# Patient Record
Sex: Female | Born: 1945 | Hispanic: Yes | Smoking: Never smoker
Health system: Southern US, Community
[De-identification: ages and names within clinical notes are randomized; demographics above are authoritative.]

## PROBLEM LIST (undated history)

## (undated) DIAGNOSIS — R112 Nausea with vomiting, unspecified: Secondary | ICD-10-CM

## (undated) DIAGNOSIS — K5792 Diverticulitis of intestine, part unspecified, without perforation or abscess without bleeding: Secondary | ICD-10-CM

## (undated) DIAGNOSIS — T8859XA Other complications of anesthesia, initial encounter: Secondary | ICD-10-CM

## (undated) DIAGNOSIS — E119 Type 2 diabetes mellitus without complications: Secondary | ICD-10-CM

## (undated) DIAGNOSIS — I10 Essential (primary) hypertension: Secondary | ICD-10-CM

## (undated) DIAGNOSIS — E7800 Pure hypercholesterolemia, unspecified: Secondary | ICD-10-CM

## (undated) DIAGNOSIS — K219 Gastro-esophageal reflux disease without esophagitis: Secondary | ICD-10-CM

## (undated) DIAGNOSIS — Z9889 Other specified postprocedural states: Secondary | ICD-10-CM

## (undated) DIAGNOSIS — K529 Noninfective gastroenteritis and colitis, unspecified: Secondary | ICD-10-CM

## (undated) DIAGNOSIS — Z8489 Family history of other specified conditions: Secondary | ICD-10-CM

---

## 1972-05-07 HISTORY — PX: ABDOMINAL HYSTERECTOMY: SHX81

## 2004-05-07 HISTORY — PX: TOTAL KNEE ARTHROPLASTY: SHX125

## 2016-05-07 HISTORY — PX: CHOLECYSTECTOMY: SHX55

## 2017-05-07 HISTORY — PX: SHOULDER ARTHROSCOPY: SHX128

## 2020-08-31 ENCOUNTER — Emergency Department (HOSPITAL_COMMUNITY): Payer: Medicare PPO

## 2020-08-31 ENCOUNTER — Emergency Department (HOSPITAL_COMMUNITY)
Admission: EM | Admit: 2020-08-31 | Discharge: 2020-08-31 | Discharge status: Against Medical Advice | Payer: Medicare PPO | Attending: Emergency Medicine | Admitting: Emergency Medicine

## 2020-08-31 ENCOUNTER — Emergency Department (HOSPITAL_BASED_OUTPATIENT_CLINIC_OR_DEPARTMENT_OTHER)
Admission: EM | Admit: 2020-08-31 | Discharge: 2020-09-01 | Payer: Medicare PPO | Source: Home / Self Care | Attending: Emergency Medicine | Admitting: Emergency Medicine

## 2020-08-31 ENCOUNTER — Other Ambulatory Visit: Payer: Self-pay

## 2020-08-31 ENCOUNTER — Encounter (HOSPITAL_BASED_OUTPATIENT_CLINIC_OR_DEPARTMENT_OTHER): Payer: Self-pay | Admitting: *Deleted

## 2020-08-31 DIAGNOSIS — R110 Nausea: Secondary | ICD-10-CM | POA: Insufficient documentation

## 2020-08-31 DIAGNOSIS — N3090 Cystitis, unspecified without hematuria: Secondary | ICD-10-CM

## 2020-08-31 DIAGNOSIS — E119 Type 2 diabetes mellitus without complications: Secondary | ICD-10-CM | POA: Insufficient documentation

## 2020-08-31 DIAGNOSIS — Z7984 Long term (current) use of oral hypoglycemic drugs: Secondary | ICD-10-CM | POA: Insufficient documentation

## 2020-08-31 DIAGNOSIS — R519 Headache, unspecified: Secondary | ICD-10-CM | POA: Insufficient documentation

## 2020-08-31 DIAGNOSIS — Z7982 Long term (current) use of aspirin: Secondary | ICD-10-CM | POA: Insufficient documentation

## 2020-08-31 DIAGNOSIS — Z79899 Other long term (current) drug therapy: Secondary | ICD-10-CM | POA: Insufficient documentation

## 2020-08-31 DIAGNOSIS — R2689 Other abnormalities of gait and mobility: Secondary | ICD-10-CM | POA: Insufficient documentation

## 2020-08-31 DIAGNOSIS — I10 Essential (primary) hypertension: Secondary | ICD-10-CM | POA: Insufficient documentation

## 2020-08-31 DIAGNOSIS — Z5321 Procedure and treatment not carried out due to patient leaving prior to being seen by health care provider: Secondary | ICD-10-CM | POA: Insufficient documentation

## 2020-08-31 DIAGNOSIS — Z96651 Presence of right artificial knee joint: Secondary | ICD-10-CM | POA: Insufficient documentation

## 2020-08-31 DIAGNOSIS — Z91040 Latex allergy status: Secondary | ICD-10-CM | POA: Insufficient documentation

## 2020-08-31 DIAGNOSIS — R42 Dizziness and giddiness: Secondary | ICD-10-CM | POA: Insufficient documentation

## 2020-08-31 HISTORY — DX: Diverticulitis of intestine, part unspecified, without perforation or abscess without bleeding: K57.92

## 2020-08-31 HISTORY — DX: Essential (primary) hypertension: I10

## 2020-08-31 HISTORY — DX: Noninfective gastroenteritis and colitis, unspecified: K52.9

## 2020-08-31 HISTORY — DX: Type 2 diabetes mellitus without complications: E11.9

## 2020-08-31 HISTORY — DX: Pure hypercholesterolemia, unspecified: E78.00

## 2020-08-31 LAB — URINALYSIS, ROUTINE W REFLEX MICROSCOPIC
Bilirubin Urine: NEGATIVE
Glucose, UA: NEGATIVE mg/dL
Ketones, ur: 5 mg/dL — AB
Nitrite: NEGATIVE
Protein, ur: 30 mg/dL — AB
Specific Gravity, Urine: 1.02 (ref 1.005–1.030)
WBC, UA: >50 WBC/hpf — ABNORMAL HIGH (ref 0–5)
pH: 6 (ref 5.0–8.0)

## 2020-08-31 LAB — CBC WITH DIFFERENTIAL/PLATELET
Abs Immature Granulocytes: 0.04 10*3/uL (ref 0.00–0.07)
Basophils Absolute: 0.1 10*3/uL (ref 0.0–0.1)
Basophils Relative: 1 %
Eosinophils Absolute: 0 10*3/uL (ref 0.0–0.5)
Eosinophils Relative: 0 %
HCT: 41.4 % (ref 36.0–46.0)
Hemoglobin: 12.9 g/dL (ref 12.0–15.0)
Immature Granulocytes: 0 %
Lymphocytes Relative: 17 %
Lymphs Abs: 1.7 10*3/uL (ref 0.7–4.0)
MCH: 28.5 pg (ref 26.0–34.0)
MCHC: 31.2 g/dL (ref 30.0–36.0)
MCV: 91.6 fL (ref 80.0–100.0)
Monocytes Absolute: 0.6 10*3/uL (ref 0.1–1.0)
Monocytes Relative: 6 %
Neutro Abs: 7.9 10*3/uL — ABNORMAL HIGH (ref 1.7–7.7)
Neutrophils Relative %: 76 %
Platelets: 340 10*3/uL (ref 150–400)
RBC: 4.52 MIL/uL (ref 3.87–5.11)
RDW: 15.3 % (ref 11.5–15.5)
WBC: 10.3 10*3/uL (ref 4.0–10.5)
nRBC: 0 % (ref 0.0–0.2)

## 2020-08-31 LAB — COMPREHENSIVE METABOLIC PANEL
ALT: 13 U/L (ref 0–44)
AST: 26 U/L (ref 15–41)
Albumin: 4 g/dL (ref 3.5–5.0)
Alkaline Phosphatase: 81 U/L (ref 38–126)
Anion gap: 14 (ref 5–15)
BUN: 13 mg/dL (ref 8–23)
CO2: 25 mmol/L (ref 22–32)
Calcium: 9.7 mg/dL (ref 8.9–10.3)
Chloride: 99 mmol/L (ref 98–111)
Creatinine, Ser: 0.68 mg/dL (ref 0.44–1.00)
GFR, Estimated: >60 mL/min (ref >60)
Glucose, Bld: 144 mg/dL — ABNORMAL HIGH (ref 70–99)
Potassium: 3.9 mmol/L (ref 3.5–5.1)
Sodium: 138 mmol/L (ref 135–145)
Total Bilirubin: 0.7 mg/dL (ref 0.3–1.2)
Total Protein: 7.8 g/dL (ref 6.5–8.1)

## 2020-08-31 LAB — CBG MONITORING, ED: Glucose-Capillary: 135 mg/dL — ABNORMAL HIGH (ref 70–99)

## 2020-08-31 MED ORDER — MAGNESIUM SULFATE IN D5W 1-5 GM/100ML-% IV SOLN
1.0000 g | Freq: Once | INTRAVENOUS | Status: AC
  Administered 2020-08-31: 1 g via INTRAVENOUS
  Filled 2020-08-31: qty 100

## 2020-08-31 MED ORDER — SULFAMETHOXAZOLE-TRIMETHOPRIM 800-160 MG PO TABS
1.0000 | ORAL_TABLET | Freq: Once | ORAL | Status: AC
  Administered 2020-08-31: 1 via ORAL
  Filled 2020-08-31: qty 1

## 2020-08-31 MED ORDER — SULFAMETHOXAZOLE-TRIMETHOPRIM 800-160 MG PO TABS: 1.0000 | ORAL_TABLET | Freq: Two times a day (BID) | ORAL | 0 refills | Status: AC

## 2020-08-31 MED ORDER — METOCLOPRAMIDE HCL 5 MG/ML IJ SOLN
10.0000 mg | Freq: Once | INTRAMUSCULAR | Status: AC
  Administered 2020-08-31: 10 mg via INTRAVENOUS
  Filled 2020-08-31: qty 2

## 2020-08-31 MED ORDER — KETOROLAC TROMETHAMINE 15 MG/ML IJ SOLN
15.0000 mg | Freq: Once | INTRAMUSCULAR | Status: AC
  Administered 2020-08-31: 15 mg via INTRAVENOUS
  Filled 2020-08-31: qty 1

## 2020-08-31 NOTE — ED Notes (Signed)
Pt could not wait any longer. RN notified.

## 2020-08-31 NOTE — ED Provider Notes (Signed)
[facility] EMERGENCY DEPT Provider Note   CSN: [id number] Arrival date & time: 08/31/20  1749     History Chief Complaint  Patient presents with  . Headache  . Dizziness    Lindsay Peterson is a 75 y.o. 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  Patient ports gradual onset of headache over the past 2 days.  The posterior headache associated with some neck pain.  She also describes nausea.  She has had some mild vertigo sensation, worse with ambulation.  She went initially to the [facility] long ED today, had preliminary labs and medical screening exam, but left after prolonged waiting time due to the wait time.  She came here with her daughter instead.  She does report a history of migraines, which she says in the past can present similarly, with posterior headache, blurred vision, nausea.  She has not had a migraine in about 15 years.  She denies dysuria, hematuria, or history of UTIs.  She does have a history of hypertension, high cholesterol, diabetes.  She takes her blood pressure medicines every day.  She reports that her blood pressures for the past 24 hours have been quite high, with systolic pressure 200-29mhg on repeat checks at home.  She does not routinely check her blood pressure and does not know what her average pressure is.  HPI     Past Medical History:  Diagnosis Date  . Colitis   . Diabetes mellitus without complication (HCC)   . Diverticulitis   . Hypercholesterolemia   . Hypertension     There are no problems to display for this patient.   Past Surgical History:  Procedure Laterality Date  . ABDOMINAL HYSTERECTOMY    . CHOLECYSTECTOMY    . TOTAL KNEE ARTHROPLASTY     right     OB History    Gravida  4   Para  2   Term      Preterm      AB  2   Living        SAB      IAB      Ectopic      Multiple      Live Births              History  reviewed. No pertinent family history.  Social History   Tobacco Use  . Smoking status: Never Smoker  . Smokeless tobacco: Never Used  Substance Use Topics  . Alcohol use: Not Currently  . Drug use: Never    Home Medications Prior to Admission medications   Medication Sig Start Date End Date Taking? Authorizing Provider  ALPRAZolam Prudy Feeler) 0.5 MG tablet Take 0.5 mg by mouth at bedtime as needed for anxiety.   Yes [provider]  aspirin EC 81 MG tablet Take 81 mg by mouth daily. Swallow whole.   Yes [provider]  dicyclomine (BENTYL) 20 MG tablet Take 20 mg by mouth 2 (two) times daily as needed for spasms.   Yes [provider]  hydrochlorothiazide (HYDRODIURIL) 12.5 MG tablet Take 12.5 mg by mouth daily.   Yes [provider]  hyoscyamine (ANASPAZ) 0.125 MG TBDP disintergrating tablet Place 0.125 mg under the tongue.   Yes [provider]  meloxicam (MOBIC) 7.5 MG tablet Take 7.5 mg by mouth daily.   Yes [provider]  montelukast (SINGULAIR) 10 MG tablet Take 10 mg by mouth at bedtime.  Yes [provider]  omeprazole (PRILOSEC) 20 MG capsule Take 20 mg by mouth 2 (two) times daily before a meal.   Yes [provider]  pravastatin (PRAVACHOL) 40 MG tablet Take 40 mg by mouth daily.   Yes [provider]  sitaGLIPtin-metformin (JANUMET) 50-1000 MG tablet Take 1 tablet by mouth 2 (two) times daily with a meal.   Yes [provider]  sulfamethoxazole-trimethoprim (BACTRIM DS) 800-160 MG tablet Take 1 tablet by mouth 2 (two) times daily for 5 days. 08/31/20 09/05/20 Yes TrifanKermit Balo, MD    Allergies    Iodine, Latex, and Penicillins  Review of Systems   Review of Systems  Constitutional: Negative for chills and fever.  HENT: Negative for ear pain and sore throat.   Eyes: Positive for visual disturbance. Negative for pain.  Respiratory: Negative for cough and shortness of breath.    Cardiovascular: Negative for chest pain and palpitations.  Gastrointestinal: Positive for nausea. Negative for vomiting.  Genitourinary: Negative for dysuria and hematuria.  Musculoskeletal: Positive for neck pain. Negative for arthralgias.  Skin: Negative for color change and rash.  Neurological: Positive for dizziness, light-headedness and headaches. Negative for syncope, speech difficulty and weakness.  All other systems reviewed and are negative.   Physical Exam Updated Vital Signs BP (!) 164/78   Pulse 81   Temp 98.1 F (36.7 C) (Oral)   Resp 18   Ht 5\' 2"  (1.575 m)   Wt 60.6 kg   SpO2 100%   BMI 24.42 kg/m   Physical Exam Constitutional:      General: She is not in acute distress. HENT:     Head: Normocephalic and atraumatic.  Eyes:     General: No visual field deficit.    Conjunctiva/sclera: Conjunctivae normal.     Pupils: Pupils are equal, round, and reactive to light.  Cardiovascular:     Rate and Rhythm: Normal rate and regular rhythm.  Pulmonary:     Effort: Pulmonary effort is normal. No respiratory distress.  Abdominal:     General: There is no distension.     Tenderness: There is no abdominal tenderness.  Skin:    General: Skin is warm and dry.  Neurological:     General: No focal deficit present.     Mental Status: She is alert. Mental status is at baseline.     GCS: GCS eye subscore is 4. GCS verbal subscore is 5. GCS motor subscore is 6.     Cranial Nerves: No dysarthria or facial asymmetry.     Sensory: No sensory deficit.  Psychiatric:        Mood and Affect: Mood normal.        Behavior: Behavior normal.     ED Results / Procedures / Treatments   Labs (all labs ordered are listed, but only abnormal results are displayed) Labs Reviewed  URINE CULTURE    EKG None  Radiology DG Chest 1 View  Result Date: 08/31/2020 CLINICAL DATA:  Hypertension, blurred vision dizziness and nausea. EXAM: CHEST  1 VIEW COMPARISON:  None FINDINGS:  Image slightly rotated to the RIGHT. Accounting for this cardiomediastinal contours and hilar structures are normal. Lungs are clear.  No sign of pleural effusion. On limited assessment no acute skeletal process. Signs of prior rotator cuff repair on the RIGHT. IMPRESSION: No active disease. Electronically Signed   By: 09/02/2020 M.D.   On: 08/31/2020 13:41   CT Head Wo Contrast  Result Date: 08/31/2020 CLINICAL  DATA:  Headache with blurred vision EXAM: CT HEAD WITHOUT CONTRAST TECHNIQUE: Contiguous axial images were obtained from the base of the skull through the vertex without intravenous contrast. COMPARISON:  Report of prior head CT examination July 02, 2008 available; images from that study not available. FINDINGS: Brain: The ventricles are normal in size and configuration. There is asymmetric mild left frontal and parietal lobe atrophy. There is no intracranial mass, hemorrhage, extra-axial fluid collection, or midline shift. There is slight decreased attenuation in portions of the centra semiovale bilaterally. No acute infarct is demonstrable. Vascular: No hyperdense vessel. There are foci of calcification in each carotid siphon region. Skull: Bony calvarium appears intact. Sinuses/Orbits: There is mucosal thickening in several ethmoid air cells. Orbits appear symmetric bilaterally. Other: Mastoid air cells are clear. 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 Ventricles normal in size and configuration. There is slight periventricular small vessel disease. No acute infarct. No mass or hemorrhage. There are foci of arterial vascular calcification. There is mucosal thickening in several ethmoid air cells. Electronically Signed   By: Bretta Bang III M.D.   On: 08/31/2020 15:08    Procedures Procedures   Medications Ordered in ED Medications  metoCLOPramide (REGLAN) injection 10 mg (10 mg Intravenous Given 08/31/20 2220)  ketorolac (TORADOL) 15 MG/ML injection 15 mg (15 mg  Intravenous Given 08/31/20 2220)  magnesium sulfate IVPB 1 g 100 mL (1 g Intravenous New Bag/Given 08/31/20 2219)  sulfamethoxazole-trimethoprim (BACTRIM DS) 800-160 MG per tablet 1 tablet (1 tablet Oral Given 08/31/20 2225)    ED Course  I have reviewed the triage vital signs and the nursing notes.  Pertinent labs & imaging results that were available during my care of the patient were reviewed by me and considered in my medical decision making (see chart for details).  74-year female presenting to the emergency department with worsening posterior headache, vertigo, nausea, some gait dysfunction, in the setting of labile blood pressure reported at home.  Her blood pressure is under control in the ED here.  She continues to have a posterior headache and nausea.  She was seen that was longer today and had blood work and a CT scan.  CT scan did not show any focal infarct.  There was some signs of sinus congestion, but the patient denies any history of sinus infections, and her headache appears to be posterior.  Basic metabolic and CBC labs are unremarkable.  Her UA does show signs of infection.  She does not have any dysuria or urinary symptoms, but I would treat her for UTI based on these results.  It is possible some of her nausea and fatigue is related to urinary infection, although I have a lower suspicion that her headache is also related to this.  Her headache may be multifactorial.  It is reminiscent to her of her prior migraines.  This is a possibility.  I ordered some migraine medications.  However given that she has multiple stroke risk factors, and his symptoms could also be consistent with a posterior infarct, I do believe the safest plan would be to obtain an MRI of the brain.  I discussed this plan with both the patient and her daughter and they agree.  We decided that she will be transferred by private vehicle to the [facility] emergency department, with her daughter driving her.  I do believe  she is stable and safe to go for transport this way.  I spoken to Dr. Alvira Monday at [facility] Ed  who accepted the patient for transfer.  I made the patient daughter aware that if her scan is negative, this is likely a complex migraine and she can be discharged home.    Clinical Course as of 08/31/20 2357  Wed Aug 31, 2020  2210 I spoke with the patient and her daughter.  They have agreed for transfer to [facility] for MRI of the brain to look for evidence for posterior stroke.  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  I have ordered and prescribed Bactrim for UTI. [MT]    Clinical Course User Index [MT] Reine Bristow, Kermit BaloMatthew J, MD    Final Clinical Impression(s) / ED Diagnoses Final diagnoses:  Nonintractable headache, unspecified chronicity pattern, unspecified headache type  Cystitis    Rx / DC Orders ED Discharge Orders         Ordered    sulfamethoxazole-trimethoprim (BACTRIM DS) 800-160 MG tablet  2 times daily        08/31/20 2357           Terald Sleeperrifan, Jeshawn Melucci J, MD 08/31/20 2357

## 2020-08-31 NOTE — ED Notes (Signed)
Patient going POV to [facility] to get MRI

## 2020-08-31 NOTE — ED Notes (Signed)
RT placed PIV right AC w/out difficulty. Flushed and saline locked, w/secure dressing.

## 2020-08-31 NOTE — ED Triage Notes (Signed)
Pt complaint of dizziness since yesterday and it progressively getting worst and a headache as well.  Pt stated that she forgot to take her Hydrochlorothiazide yesterday and when she remembered that she forgot to take it, she took 2 pills.

## 2020-08-31 NOTE — ED Triage Notes (Signed)
Pt here from home with c/o blurred vision and dizzy since 4pm yesterday , hx of htn

## 2020-08-31 NOTE — Discharge Instructions (Addendum)
Please drive directly to [facility] Emergency department.  Bring these papers with you.  I have ordered an MRI scan of your brain to look specifically for signs of stroke in the back of the brain.  If your MRI scan is normal, it is most likely that you are having a migraine.  We have given you some migraine medications as well.

## 2020-08-31 NOTE — ED Notes (Signed)
Pt ambulated to bathroom with some assistance 

## 2020-08-31 NOTE — ED Triage Notes (Signed)
Emergency Medicine Provider Triage Evaluation Note  Lindsay Peterson , a 75 y.o. female  was evaluated in triage.  Pt complains of headache today. States felt dizzy- room spinning around 4PM yesterday, skipped BP meds yesterday AM, checked BP and it was 217 systolic. Came today due to headache. Denies CP, SHOB, leg edema. Review of Systems  Positive: headache Negative: CP, SHOB, swelling, visual or gait disturbance   Physical Exam  BP (!) 149/86 (BP Location: Right Arm)   Pulse 93   Temp 99.7 F (37.6 C) (Oral)   Resp 14   SpO2 100%  Gen:   Awake, no distress   HEENT:  Atraumatic  Resp:  Normal effort  Cardiac:  Normal rate  Abd:   Nondistended, nontender  MSK:   Moves extremities without difficulty  Neuro:  Speech clear   Medical Decision Making  Medically screening exam initiated at 12:53 PM.  Appropriate orders placed.  Adelaine Roppolo was informed that the remainder of the evaluation will be completed by another provider, this initial triage assessment does not replace that evaluation, and the importance of remaining in the ED until their evaluation is complete.  Clinical Impression     Jeannie Fend, PA-C 08/31/20 1256

## 2020-09-01 NOTE — ED Notes (Signed)
Patient informed by this nurse to report to [facility] for MRI at this time. Patient leaves POV with daughter. #20g securely in place in the right ac. Patient informed that if she did not decides to stay at [facility] she should have Nurse remove IV before leaving.

## 2020-09-03 LAB — URINE CULTURE: Culture: >=100000 — AB

## 2020-09-04 ENCOUNTER — Telehealth: Payer: Self-pay

## 2020-09-04 NOTE — Telephone Encounter (Signed)
Post ED Visit - Positive Culture Follow-up  Culture report reviewed by antimicrobial stewardship pharmacist: [facility] []  , Pharm.D. []  Enzo Bi, Pharm.D., BCPS AQ-ID []  , Pharm.D., BCPS []  Celedonio Miyamoto, .D., BCPS []  Trilla, .D., BCPS, AAHIVP []  Georgina Pillion, Pharm.D., BCPS, AAHIVP [x]  [address], PharmD, BCPS []  , PharmD, BCPS []  [place], PharmD, BCPS []  [place], PharmD []  , PharmD, BCPS []  Estella Husk, PharmD  Pharmacy Team []  Lysle Pearl, PharmD []  , PharmD []  Phillips Climes, PharmD []  , Rph []  Agapito Games) , PharmD []  Verlan Friends, PharmD []  , PharmD []  Mervyn Gay, PharmD []  , PharmD []  Vinnie Level, PharmD []  [facility], PharmD []  , PharmD []  Len Childs, PharmD   Positive urine culture Treated with Bactrim DS, organism sensitive to the same and no further patient follow-up is required at this time.  09/04/2020, 10:41 AM

## 2020-09-30 NOTE — Patient Instructions (Addendum)
DUE TO COVID-19 ONLY ONE VISITOR IS ALLOWED TO COME WITH YOU AND STAY IN THE WAITING ROOM ONLY DURING PRE OP AND PROCEDURE DAY OF SURGERY. THE 1 VISITOR  MAY VISIT WITH YOU AFTER SURGERY IN YOUR PRIVATE ROOM DURING VISITING HOURS ONLY!                  Lindsay Peterson   Your procedure is scheduled on: 10/13/20   Report to [facility] Main  Entrance   Report to admitting at  7:30 AM     Call this number if you have problems the morning of surgery [phone]    BRUSH YOUR TEETH MORNING OF SURGERY AND RINSE YOUR MOUTH OUT, NO CHEWING GUM CANDY OR MINTS.   No food after midnight.    You may have clear liquid until 7:00 AM.    At 6:30 AM drink pre surgery drink.   Nothing by mouth after 7:00 AM.   Take these medicines the morning of surgery with A SIP OF WATER: Amlodipine, Omeprazole. Bring your mask and tubing  How to Manage Your Diabetes Before and After Surgery  Why is it important to control my blood sugar before and after surgery? . Improving blood sugar levels before and after surgery helps healing and can limit problems. . A way of improving blood sugar control is eating a healthy diet by: o  Eating less sugar and carbohydrates o  Increasing activity/exercise o  Talking with your doctor about reaching your blood sugar goals . High blood sugars (greater than 180 mg/dL) can raise your risk of infections and slow your recovery, so you will need to focus on controlling your diabetes during the weeks before surgery. . Make sure that the doctor who takes care of your diabetes knows about your planned surgery including the date and location.  How do I manage my blood sugar before surgery? . Check your blood sugar at least 4 times a day, starting 2 days before surgery, to make sure that the level is not too high or low. o Check your blood sugar the morning of your surgery when you wake up and every 2 hours until you get to the Short Stay unit. . If your blood sugar is  less than 70 mg/dL, you will need to treat for low blood sugar: o Do not take insulin. o Treat a low blood sugar (less than 70 mg/dL) with  cup of clear juice (cranberry or apple), 4 glucose tablets, OR glucose gel. o Recheck blood sugar in 15 minutes after treatment (to make sure it is greater than 70 mg/dL). If your blood sugar is not greater than 70 mg/dL on recheck, call [phone] for further instructions. . Report your blood sugar to the short stay nurse when you get to Short Stay.  . If you are admitted to the hospital after surgery: o Your blood sugar will be checked by the staff and you will probably be given insulin after surgery (instead of oral diabetes medicines) to make sure you have good blood sugar levels. o The goal for blood sugar control after surgery is 80-180 mg/dL.   WHAT DO I DO ABOUT MY DIABETES MEDICATION?  Marland Kitchen Do not take oral diabetes medicines (pills) the morning of surgery.                                You may not have any metal on your body including hair  pins and              piercings  Do not wear jewelry, make-up, lotions, powders or perfumes, deodorant             Do not wear nail polish on your fingernails.  Do not shave  48 hours prior to surgery.              Men may shave face and neck.   Do not bring valuables to the hospital. [facility] IS NOT             RESPONSIBLE   FOR VALUABLES.  Contacts, dentures or bridgework may not be worn into surgery.  Leave suitcase in the car. After surgery it may be brought to your room.     Patients discharged the day of surgery will not be allowed to drive home.  IF YOU ARE HAVING SURGERY AND GOING HOME THE SAME DAY, YOU MUST HAVE AN ADULT TO DRIVE YOU HOME AND BE WITH YOU FOR 24 HOURS.  YOU MAY GO HOME BY TAXI OR UBER OR ORTHERWISE, BUT AN ADULT MUST ACCOMPANY YOU HOME AND STAY WITH YOU FOR 24 HOURS.  Name and phone number of your driver:  Special Instructions: N/A              Please read over the  following fact sheets you were given: _____________________________________________________________________  [facility]- Preparing for Total Shoulder Arthroplasty    Before surgery, you can play an important role. Because skin is not sterile, your skin needs to be as free of germs as possible. You can reduce the number of germs on your skin by using the following products. . Benzoyl Peroxide Gel o Reduces the number of germs present on the skin o Applied twice a day to shoulder area starting two days before surgery    ==================================================================  Please follow these instructions carefully:  BENZOYL PEROXIDE 5% GEL  Please do not use if you have an allergy to benzoyl peroxide.   If your skin becomes reddened/irritated stop using the benzoyl peroxide.  Starting two days before surgery, apply as follows: 1. Apply benzoyl peroxide in the morning and at night. Apply after taking a shower. If you are not taking a shower clean entire shoulder front, back, and side along with the armpit with a clean wet washcloth.  2. Place a quarter-sized dollop on your shoulder and rub in thoroughly, making sure to cover the front, back, and side of your shoulder, along with the armpit.   2 days before ____ AM   ____ PM              1 day before ____ AM   ____ PM                         3. Do this twice a day for two days.  (Last application is the night before surgery, AFTER using the CHG soap as described below).  4. Do NOT apply benzoyl peroxide gel on the day of surgery.           [facility] - Preparing for Surgery Before surgery, you can play an important role.  Because skin is not sterile, your skin needs to be as free of germs as possible.  You can reduce the number of germs on your skin by washing with CHG (chlorahexidine gluconate) soap before surgery.  CHG is an antiseptic cleaner which kills germs and bonds with  the skin to continue killing germs even after  washing. Please DO NOT use if you have an allergy to CHG or antibacterial soaps.  If your skin becomes reddened/irritated stop using the CHG and inform your nurse when you arrive at Short Stay. Do not shave (including legs and underarms) for at least 48 hours prior to the first CHG shower. . Please follow these instructions carefully:  1.  Shower with CHG Soap the night before surgery and the  morning of Surgery.  2.  If you choose to wash your hair, wash your hair first as usual with your  normal  shampoo.  3.  After you shampoo, rinse your hair and body thoroughly to remove the  shampoo.                                        4.  Use CHG as you would any other liquid soap.  You can apply chg directly  to the skin and wash                       Gently with a scrungie or clean washcloth.  5.  Apply the CHG Soap to your body ONLY FROM THE NECK DOWN.   Do not use on face/ open                           Wound or open sores. Avoid contact with eyes, ears mouth and genitals (private parts).                       Wash face,  Genitals (private parts) with your normal soap.             6.  Wash thoroughly, paying special attention to the area where your surgery  will be performed.  7.  Thoroughly rinse your body with warm water from the neck down.  8.  DO NOT shower/wash with your normal soap after using and rinsing off  the CHG Soap.                9.  Pat yourself dry with a clean towel.            10.  Wear clean pajamas.            11.  Place clean sheets on your bed the night of your first shower and do not  sleep with pets. Day of Surgery : Do not apply any lotions/deodorants the morning of surgery.  Please wear clean clothes to the hospital/surgery center.  FAILURE TO FOLLOW THESE INSTRUCTIONS MAY RESULT IN THE CANCELLATION OF YOUR SURGERY PATIENT SIGNATURE_________________________________  NURSE  SIGNATURE__________________________________  ________________________________________________________________________

## 2020-10-04 ENCOUNTER — Encounter (HOSPITAL_COMMUNITY): Payer: Self-pay

## 2020-10-04 ENCOUNTER — Other Ambulatory Visit: Payer: Self-pay

## 2020-10-04 ENCOUNTER — Encounter (HOSPITAL_COMMUNITY)
Admission: RE | Admit: 2020-10-04 | Discharge: 2020-10-04 | Disposition: A | Discharge status: Home / Self Care | Payer: Medicare PPO | Source: Ambulatory Visit | Attending: Orthopedic Surgery | Admitting: Orthopedic Surgery

## 2020-10-04 DIAGNOSIS — Z01812 Encounter for preprocedural laboratory examination: Secondary | ICD-10-CM | POA: Insufficient documentation

## 2020-10-04 HISTORY — DX: Family history of other specified conditions: Z84.89

## 2020-10-04 HISTORY — DX: Gastro-esophageal reflux disease without esophagitis: K21.9

## 2020-10-04 HISTORY — DX: Other complications of anesthesia, initial encounter: T88.59XA

## 2020-10-04 HISTORY — DX: Nausea with vomiting, unspecified: R11.2

## 2020-10-04 HISTORY — DX: Other specified postprocedural states: Z98.890

## 2020-10-04 LAB — SURGICAL PCR SCREEN
MRSA, PCR: NEGATIVE
Staphylococcus aureus: POSITIVE — AB

## 2020-10-04 LAB — CBC
HCT: 38.1 % (ref 36.0–46.0)
Hemoglobin: 12.5 g/dL (ref 12.0–15.0)
MCH: 29.3 pg (ref 26.0–34.0)
MCHC: 32.8 g/dL (ref 30.0–36.0)
MCV: 89.2 fL (ref 80.0–100.0)
Platelets: 363 10*3/uL (ref 150–400)
RBC: 4.27 MIL/uL (ref 3.87–5.11)
RDW: 14.6 % (ref 11.5–15.5)
WBC: 9.3 10*3/uL (ref 4.0–10.5)
nRBC: 0 % (ref 0.0–0.2)

## 2020-10-04 LAB — BASIC METABOLIC PANEL
Anion gap: 11 (ref 5–15)
BUN: 24 mg/dL — ABNORMAL HIGH (ref 8–23)
CO2: 26 mmol/L (ref 22–32)
Calcium: 10.2 mg/dL (ref 8.9–10.3)
Chloride: 101 mmol/L (ref 98–111)
Creatinine, Ser: 0.55 mg/dL (ref 0.44–1.00)
GFR, Estimated: >60 mL/min (ref >60)
Glucose, Bld: 145 mg/dL — ABNORMAL HIGH (ref 70–99)
Potassium: 3.7 mmol/L (ref 3.5–5.1)
Sodium: 138 mmol/L (ref 135–145)

## 2020-10-04 LAB — GLUCOSE, CAPILLARY: Glucose-Capillary: 148 mg/dL — ABNORMAL HIGH (ref 70–99)

## 2020-10-04 NOTE — Progress Notes (Signed)
COVID Vaccine Completed:no Date COVID Vaccine completed: COVID vaccine manufacturer: [employer]   PCP - Lorelei Pont DO Cardiologist - no  Chest x-ray - 08/31/20-epic EKG -09/01/20-epic  Stress Test - 10/27/18-epic ECHO - no Cardiac Cath - no Pacemaker/ICD device last checked:NA  Sleep Study - yes CPAP - yes  Fasting Blood Sugar - 90-110 Checks Blood Sugar _____ times a day.Continuous with a Libre. It will be on the Lt arm on DOS  Blood Thinner Instructions:ASA 81/ Dr. Leary Roca Aspirin Instructions:Stop 5 days prior to DOS/ Dr. Rennis Chris Last Dose:10/07/20  Anesthesia review:   Patient denies shortness of breath, fever, cough and chest pain at PAT appointment Yes. Pt walks regularly and has no SOB climbing her stairs in her house, while doing house work or with ADLs.  Patient verbalized understanding of instructions that were given to them at the PAT appointment. Patient was also instructed that they will need to review over the PAT instructions again at home before surgery.yes

## 2020-10-05 LAB — HEMOGLOBIN A1C
Hgb A1c MFr Bld: 7.3 % — ABNORMAL HIGH (ref 4.8–5.6)
Mean Plasma Glucose: 163 mg/dL

## 2020-10-13 ENCOUNTER — Ambulatory Visit (HOSPITAL_COMMUNITY): Payer: Medicare PPO | Admitting: Anesthesiology

## 2020-10-13 ENCOUNTER — Encounter (HOSPITAL_COMMUNITY)
Admission: RE | Disposition: A | Discharge status: Home / Self Care | Payer: Self-pay | Source: Home / Self Care | Attending: Orthopedic Surgery

## 2020-10-13 ENCOUNTER — Ambulatory Visit (HOSPITAL_COMMUNITY)
Admission: RE | Admit: 2020-10-13 | Discharge: 2020-10-13 | Disposition: A | Discharge status: Home / Self Care | Payer: Medicare PPO | Attending: Orthopedic Surgery | Admitting: Orthopedic Surgery

## 2020-10-13 ENCOUNTER — Ambulatory Visit (HOSPITAL_COMMUNITY): Payer: Medicare PPO | Admitting: Physician Assistant

## 2020-10-13 DIAGNOSIS — Z7984 Long term (current) use of oral hypoglycemic drugs: Secondary | ICD-10-CM | POA: Diagnosis not present

## 2020-10-13 DIAGNOSIS — Z79899 Other long term (current) drug therapy: Secondary | ICD-10-CM | POA: Diagnosis not present

## 2020-10-13 DIAGNOSIS — M75101 Unspecified rotator cuff tear or rupture of right shoulder, not specified as traumatic: Secondary | ICD-10-CM | POA: Insufficient documentation

## 2020-10-13 HISTORY — PX: REVERSE SHOULDER ARTHROPLASTY: SHX5054

## 2020-10-13 LAB — GLUCOSE, CAPILLARY
Glucose-Capillary: 138 mg/dL — ABNORMAL HIGH (ref 70–99)
Glucose-Capillary: 146 mg/dL — ABNORMAL HIGH (ref 70–99)

## 2020-10-13 SURGERY — ARTHROPLASTY, SHOULDER, TOTAL, REVERSE
Anesthesia: General | Site: Shoulder | Laterality: Right

## 2020-10-13 MED ORDER — BUPIVACAINE HCL (PF) 0.5 % IJ SOLN
INTRAMUSCULAR | Status: DC | PRN
  Administered 2020-10-13: 15 mL via PERINEURAL

## 2020-10-13 MED ORDER — CHLORHEXIDINE GLUCONATE 0.12 % MT SOLN
15.0000 mL | Freq: Once | OROMUCOSAL | Status: AC
Start: 2020-10-13 — End: 2020-10-13
  Administered 2020-10-13: 15 mL via OROMUCOSAL

## 2020-10-13 MED ORDER — MIDAZOLAM HCL 2 MG/2ML IJ SOLN
INTRAMUSCULAR | Status: AC
  Administered 2020-10-13: 2 mg via INTRAVENOUS
  Filled 2020-10-13: qty 2

## 2020-10-13 MED ORDER — FENTANYL CITRATE (PF) 100 MCG/2ML IJ SOLN: 50.0000 ug | INTRAMUSCULAR | Status: DC

## 2020-10-13 MED ORDER — TRANEXAMIC ACID-NACL 1000-0.7 MG/100ML-% IV SOLN
1000.0000 mg | INTRAVENOUS | Status: AC
Start: 2020-10-13 — End: 2020-10-13
  Administered 2020-10-13: 1000 mg via INTRAVENOUS
  Filled 2020-10-13: qty 100

## 2020-10-13 MED ORDER — MELOXICAM 15 MG PO TABS: 15.0000 mg | ORAL_TABLET | Freq: Every day | ORAL | 1 refills | Status: AC

## 2020-10-13 MED ORDER — ROCURONIUM BROMIDE 10 MG/ML (PF) SYRINGE
PREFILLED_SYRINGE | INTRAVENOUS | Status: DC | PRN
  Administered 2020-10-13: 70 mg via INTRAVENOUS

## 2020-10-13 MED ORDER — OXYCODONE HCL 5 MG/5ML PO SOLN: 5.0000 mg | Freq: Once | ORAL | Status: DC | PRN

## 2020-10-13 MED ORDER — VANCOMYCIN HCL 1000 MG IV SOLR
INTRAVENOUS | Status: AC
  Filled 2020-10-13: qty 1000

## 2020-10-13 MED ORDER — DIPHENHYDRAMINE HCL 50 MG/ML IJ SOLN
INTRAMUSCULAR | Status: DC | PRN
  Administered 2020-10-13: 12.5 mg via INTRAVENOUS

## 2020-10-13 MED ORDER — LACTATED RINGERS IV BOLUS
250.0000 mL | Freq: Once | INTRAVENOUS | Status: AC
  Administered 2020-10-13: 250 mL via INTRAVENOUS

## 2020-10-13 MED ORDER — ONDANSETRON HCL 4 MG/2ML IJ SOLN
INTRAMUSCULAR | Status: DC | PRN
  Administered 2020-10-13: 4 mg via INTRAVENOUS

## 2020-10-13 MED ORDER — MIDAZOLAM HCL 2 MG/2ML IJ SOLN
1.0000 mg | INTRAMUSCULAR | Status: DC
Start: 2020-10-13 — End: 2020-10-13

## 2020-10-13 MED ORDER — CYCLOBENZAPRINE HCL 10 MG PO TABS: 10.0000 mg | ORAL_TABLET | Freq: Three times a day (TID) | ORAL | 1 refills | Status: AC | PRN

## 2020-10-13 MED ORDER — LACTATED RINGERS IV BOLUS
500.0000 mL | Freq: Once | INTRAVENOUS | Status: AC
  Administered 2020-10-13: 500 mL via INTRAVENOUS

## 2020-10-13 MED ORDER — HYDROMORPHONE HCL 1 MG/ML IJ SOLN: 0.2500 mg | INTRAMUSCULAR | Status: DC | PRN

## 2020-10-13 MED ORDER — PHENYLEPHRINE HCL-NACL 10-0.9 MG/250ML-% IV SOLN
INTRAVENOUS | Status: DC | PRN
  Administered 2020-10-13: 50 ug/min via INTRAVENOUS

## 2020-10-13 MED ORDER — FENTANYL CITRATE (PF) 100 MCG/2ML IJ SOLN
INTRAMUSCULAR | Status: AC
  Filled 2020-10-13: qty 2

## 2020-10-13 MED ORDER — OXYCODONE HCL 5 MG PO TABS
5.0000 mg | ORAL_TABLET | Freq: Once | ORAL | Status: DC | PRN
Start: 2020-10-13 — End: 2020-10-13

## 2020-10-13 MED ORDER — SUGAMMADEX SODIUM 200 MG/2ML IV SOLN
INTRAVENOUS | Status: DC | PRN
  Administered 2020-10-13: 240 mg via INTRAVENOUS

## 2020-10-13 MED ORDER — FENTANYL CITRATE (PF) 100 MCG/2ML IJ SOLN
INTRAMUSCULAR | Status: DC | PRN
  Administered 2020-10-13 (×2): 50 ug via INTRAVENOUS

## 2020-10-13 MED ORDER — BUPIVACAINE LIPOSOME 1.3 % IJ SUSP
INTRAMUSCULAR | Status: DC | PRN
  Administered 2020-10-13: 10 mL via PERINEURAL

## 2020-10-13 MED ORDER — LACTATED RINGERS IV SOLN: INTRAVENOUS | Status: DC

## 2020-10-13 MED ORDER — DEXAMETHASONE SODIUM PHOSPHATE 10 MG/ML IJ SOLN
INTRAMUSCULAR | Status: AC
  Filled 2020-10-13: qty 1

## 2020-10-13 MED ORDER — ONDANSETRON HCL 4 MG/2ML IJ SOLN
INTRAMUSCULAR | Status: AC
  Filled 2020-10-13: qty 2

## 2020-10-13 MED ORDER — LIDOCAINE 2% (20 MG/ML) 5 ML SYRINGE
INTRAMUSCULAR | Status: AC
  Filled 2020-10-13: qty 5

## 2020-10-13 MED ORDER — ONDANSETRON HCL 4 MG PO TABS: 4.0000 mg | ORAL_TABLET | Freq: Three times a day (TID) | ORAL | 0 refills | Status: AC | PRN

## 2020-10-13 MED ORDER — OXYCODONE-ACETAMINOPHEN 5-325 MG PO TABS: 1.0000 | ORAL_TABLET | ORAL | 0 refills | Status: AC | PRN

## 2020-10-13 MED ORDER — MIDAZOLAM HCL 2 MG/2ML IJ SOLN
INTRAMUSCULAR | Status: AC
  Filled 2020-10-13: qty 2

## 2020-10-13 MED ORDER — DIPHENHYDRAMINE HCL 50 MG/ML IJ SOLN
INTRAMUSCULAR | Status: AC
  Filled 2020-10-13: qty 1

## 2020-10-13 MED ORDER — ACETAMINOPHEN 10 MG/ML IV SOLN
1000.0000 mg | Freq: Once | INTRAVENOUS | Status: DC | PRN
Start: 2020-10-13 — End: 2020-10-13

## 2020-10-13 MED ORDER — PROPOFOL 10 MG/ML IV BOLUS
INTRAVENOUS | Status: AC
  Filled 2020-10-13: qty 20

## 2020-10-13 MED ORDER — SODIUM CHLORIDE 0.9 % IR SOLN
Status: DC | PRN
  Administered 2020-10-13: 1000 mL

## 2020-10-13 MED ORDER — DEXAMETHASONE SODIUM PHOSPHATE 10 MG/ML IJ SOLN
INTRAMUSCULAR | Status: DC | PRN
  Administered 2020-10-13: 5 mg via INTRAVENOUS

## 2020-10-13 MED ORDER — PROPOFOL 10 MG/ML IV BOLUS
INTRAVENOUS | Status: DC | PRN
  Administered 2020-10-13: 120 mg via INTRAVENOUS

## 2020-10-13 MED ORDER — CEFAZOLIN SODIUM-DEXTROSE 2-4 GM/100ML-% IV SOLN
2.0000 g | INTRAVENOUS | Status: AC
  Administered 2020-10-13: 2 mg via INTRAVENOUS

## 2020-10-13 MED ORDER — FENTANYL CITRATE (PF) 100 MCG/2ML IJ SOLN
INTRAMUSCULAR | Status: AC
  Administered 2020-10-13: 50 ug via INTRAVENOUS
  Filled 2020-10-13: qty 2

## 2020-10-13 MED ORDER — VANCOMYCIN HCL 1000 MG IV SOLR
INTRAVENOUS | Status: DC | PRN
  Administered 2020-10-13: 1000 mg via TOPICAL

## 2020-10-13 MED ORDER — ONDANSETRON HCL 4 MG/2ML IJ SOLN: 4.0000 mg | Freq: Once | INTRAMUSCULAR | Status: DC | PRN

## 2020-10-13 MED ORDER — LIDOCAINE 2% (20 MG/ML) 5 ML SYRINGE
INTRAMUSCULAR | Status: DC | PRN
  Administered 2020-10-13: 100 mg via INTRAVENOUS

## 2020-10-13 MED ORDER — CEFAZOLIN SODIUM-DEXTROSE 2-4 GM/100ML-% IV SOLN
INTRAVENOUS | Status: AC
  Filled 2020-10-13: qty 100

## 2020-10-13 MED ORDER — ROCURONIUM BROMIDE 10 MG/ML (PF) SYRINGE
PREFILLED_SYRINGE | INTRAVENOUS | Status: AC
  Filled 2020-10-13: qty 10

## 2020-10-13 MED ORDER — ORAL CARE MOUTH RINSE: 15.0000 mL | Freq: Once | OROMUCOSAL | Status: AC

## 2020-10-13 SURGICAL SUPPLY — 64 items
BAG ZIPLOCK 12X15 (MISCELLANEOUS) ×2 IMPLANT
BLADE SAW SGTL 83.5X18.5 (BLADE) ×2 IMPLANT
COOLER ICEMAN CLASSIC (MISCELLANEOUS) IMPLANT
COVER BACK TABLE 60X90IN (DRAPES) ×2 IMPLANT
COVER SURGICAL LIGHT HANDLE (MISCELLANEOUS) ×2 IMPLANT
COVER WAND RF STERILE (DRAPES) IMPLANT
CUP SUT UNIV REVERS 36 NEUTRAL (Cup) ×2 IMPLANT
DERMABOND ADVANCED (GAUZE/BANDAGES/DRESSINGS) ×1
DERMABOND ADVANCED .7 DNX12 (GAUZE/BANDAGES/DRESSINGS) ×1 IMPLANT
DRAPE INCISE IOBAN 66X45 STRL (DRAPES) IMPLANT
DRAPE ORTHO SPLIT 77X108 STRL (DRAPES) ×2
DRAPE SHEET LG 3/4 BI-LAMINATE (DRAPES) ×2 IMPLANT
DRAPE SURG 17X11 SM STRL (DRAPES) ×2 IMPLANT
DRAPE SURG ORHT 6 SPLT 77X108 (DRAPES) ×2 IMPLANT
DRAPE U-SHAPE 47X51 STRL (DRAPES) ×2 IMPLANT
DRESSING AQUACEL AG SP 3.5X6 (GAUZE/BANDAGES/DRESSINGS) ×1 IMPLANT
DRSG AQUACEL AG ADV 3.5X 6 (GAUZE/BANDAGES/DRESSINGS) ×2 IMPLANT
DRSG AQUACEL AG ADV 3.5X10 (GAUZE/BANDAGES/DRESSINGS) ×2 IMPLANT
DRSG AQUACEL AG SP 3.5X6 (GAUZE/BANDAGES/DRESSINGS) ×2
DURAPREP 26ML APPLICATOR (WOUND CARE) ×2 IMPLANT
ELECT BLADE TIP CTD 4 INCH (ELECTRODE) ×2 IMPLANT
ELECT REM PT RETURN 15FT ADLT (MISCELLANEOUS) ×2 IMPLANT
FACESHIELD WRAPAROUND (MASK) ×8 IMPLANT
GLENOID UNI REV MOD 24 +2 LAT (Joint) ×2 IMPLANT
GLENOSPHERE 36 +4 LAT/24 (Joint) ×2 IMPLANT
GLOVE SRG 8 PF TXTR STRL LF DI (GLOVE) ×1 IMPLANT
GLOVE SURG ENC MOIS LTX SZ7 (GLOVE) ×2 IMPLANT
GLOVE SURG ENC MOIS LTX SZ7.5 (GLOVE) ×2 IMPLANT
GLOVE SURG UNDER POLY LF SZ7 (GLOVE) ×2 IMPLANT
GLOVE SURG UNDER POLY LF SZ8 (GLOVE) ×1
GOWN STRL REUS W/TWL LRG LVL3 (GOWN DISPOSABLE) ×4 IMPLANT
INSERT HUMERAL 36 +6 (Shoulder) ×2 IMPLANT
KIT BASIN OR (CUSTOM PROCEDURE TRAY) ×2 IMPLANT
KIT TURNOVER KIT A (KITS) ×2 IMPLANT
MANIFOLD NEPTUNE II (INSTRUMENTS) ×2 IMPLANT
NEEDLE TAPERED W/ NITINOL LOOP (MISCELLANEOUS) ×2 IMPLANT
NS IRRIG 1000ML POUR BTL (IV SOLUTION) ×2 IMPLANT
PACK SHOULDER (CUSTOM PROCEDURE TRAY) ×2 IMPLANT
PAD ARMBOARD 7.5X6 YLW CONV (MISCELLANEOUS) ×2 IMPLANT
PAD COLD SHLDR WRAP-ON (PAD) IMPLANT
PIN NITINOL TARGETER 2.8 (PIN) IMPLANT
PIN SET MODULAR GLENOID SYSTEM (PIN) ×2 IMPLANT
RESTRAINT HEAD UNIVERSAL NS (MISCELLANEOUS) ×2 IMPLANT
SCREW CENTRAL MODULAR 25 (Screw) ×2 IMPLANT
SCREW PERI LOCK 5.5X16 (Screw) ×4 IMPLANT
SCREW PERI LOCK 5.5X32 (Screw) ×2 IMPLANT
SCREW PERIPHERAL 5.5X20 LOCK (Screw) ×2 IMPLANT
SLING ARM FOAM STRAP LRG (SOFTGOODS) IMPLANT
SLING ARM FOAM STRAP MED (SOFTGOODS) IMPLANT
SPONGE LAP 18X18 RF (DISPOSABLE) IMPLANT
STEM HUMERAL UNIVER REV SIZE 7 (Stem) ×2 IMPLANT
SUCTION FRAZIER HANDLE 12FR (TUBING) ×1
SUCTION TUBE FRAZIER 12FR DISP (TUBING) ×1 IMPLANT
SUT FIBERWIRE #2 38 T-5 BLUE (SUTURE)
SUT MNCRL AB 3-0 PS2 18 (SUTURE) ×2 IMPLANT
SUT MON AB 2-0 CT1 36 (SUTURE) ×2 IMPLANT
SUT VIC AB 1 CT1 36 (SUTURE) ×2 IMPLANT
SUTURE FIBERWR #2 38 T-5 BLUE (SUTURE) IMPLANT
SUTURE TAPE 1.3 40 TPR END (SUTURE) ×2 IMPLANT
SUTURETAPE 1.3 40 TPR END (SUTURE) ×4
TOWEL OR 17X26 10 PK STRL BLUE (TOWEL DISPOSABLE) ×2 IMPLANT
TOWEL OR NON WOVEN STRL DISP B (DISPOSABLE) ×2 IMPLANT
WATER STERILE IRR 1000ML POUR (IV SOLUTION) ×4 IMPLANT
YANKAUER SUCT BULB TIP 10FT TU (MISCELLANEOUS) ×2 IMPLANT

## 2020-10-13 NOTE — Evaluation (Signed)
Occupational Therapy Evaluation Patient Details Name: Lindsay Peterson MRN: [id number] DOB: [birth year] Today's Date: 10/13/2020    History of Present Illness Patient s/p right reverse TSA   Clinical Impression   Lindsay Peterson is a 75 year old woman s/p reverse shoulder replacement without functional use of right dominant upper extremity secondary to effects of surgery, interscalene block and shoulder precautions. Therapist provided education and instruction to patient and daughter in regards to exercises, precautions, positioning, donning upper extremity clothing and bathing while maintaining shoulder precautions, ice and edema management via iceman and cuff and donning/doffing sling. Patient and daughter verbalized understanding and handouts provided to maximize retention of education. Patient needed assistance to donn shirt, underwear, pants, and shoes and provided with instruction on compensatory strategies to perform ADLs. Patient unsteady with ambulation secondary to effects of anesthesia. Therapist demonstrated how to assist with ambulation using hand hold. Patient to follow up with MD for further therapy needs.      Follow Up Recommendations  Follow surgeon's recommendation for DC plan and follow-up therapies    Equipment Recommendations  None recommended by OT    Recommendations for Other Services       Precautions / Restrictions Precautions Precautions: Shoulder Type of Shoulder Precautions: If sitting in controlled environment, ok to come out of sling to give neck a break. Please sleep in it to protect until follow up in office.     OK to use operative arm for feeding, hygiene and ADLs.   Ok to instruct Pendulums and lap slides as exercises. Ok to use operative arm within the following parameters for ADL purposes     New ROM (9/93)   Ok for PROM, AAROM, AROM within pain tolerance and within the following ROM   ER 20   ABD 45   FE 60 Precaution Booklet Issued:   (handouts) Required Braces or Orthoses: Sling Restrictions Weight Bearing Restrictions: Yes RUE Weight Bearing: Non weight bearing      Mobility Bed Mobility                    Transfers Overall transfer level: Needs assistance   Transfers: Sit to/from Stand Sit to Stand: Min assist         General transfer comment: Min assist to stand and stabilize during ambulation - secondary to effects of anesthesia    Balance Overall balance assessment: Mild deficits observed, not formally tested                                         ADL either performed or assessed with clinical judgement   ADL Overall ADL's : Needs assistance/impaired Eating/Feeding: Set up   Grooming: Set up   Upper Body Bathing: Adhering to UE precautions;Sitting;Minimal assistance   Lower Body Bathing: Sit to/from stand;Moderate assistance   Upper Body Dressing : Maximal assistance;Sitting;Adhering to UE precautions   Lower Body Dressing: Maximal assistance;Sit to/from stand   Toilet Transfer: Minimal assistance;Ambulation;Grab bars;Regular Social worker and Hygiene: Sit to/from stand;Moderate assistance               Vision Patient Visual Report: No change from baseline       Perception     Praxis      Pertinent Vitals/Pain Pain Assessment: Faces Faces Pain Scale: Hurts a little bit Pain Location: R shoulder (with PROM) Pain Descriptors / Indicators: Grimacing Pain  Intervention(s): Limited activity within patient's tolerance;Monitored during session     Hand Dominance Right   Extremity/Trunk Assessment Upper Extremity Assessment Upper Extremity Assessment: RUE deficits/detail RUE Deficits / Details: Minimal active ROM due to effects of interscalene block RUE Sensation: decreased light touch RUE Coordination: decreased fine motor;decreased gross motor   Lower Extremity Assessment Lower Extremity Assessment: Overall WFL for  tasks assessed   Cervical / Trunk Assessment Cervical / Trunk Assessment: Normal   Communication Communication Communication: No difficulties   Cognition Arousal/Alertness: Awake/alert Behavior During Therapy: WFL for tasks assessed/performed Overall Cognitive Status: Within Functional Limits for tasks assessed                                     General Comments       Exercises     Shoulder Instructions Shoulder Instructions Donning/doffing shirt without moving shoulder: Caregiver independent with task Method for sponge bathing under operated UE: Caregiver independent with task Donning/doffing sling/immobilizer: Caregiver independent with task Correct positioning of sling/immobilizer: Caregiver independent with task Pendulum exercises (written home exercise program): Caregiver independent with task ROM for elbow, wrist and digits of operated UE: Caregiver independent with task Sling wearing schedule (on at all times/off for ADL's): Caregiver independent with task Proper positioning of operated UE when showering: Caregiver independent with task Dressing change: Caregiver independent with task Positioning of UE while sleeping: Caregiver independent with task    Home Living Family/patient expects to be discharged to:: Private residence Living Arrangements: Other relatives Available Help at Discharge: Family;Available 24 hours/day Type of Home: House Home Access: Stairs to enter [employer] of Steps: 4 Entrance Stairs-Rails: Right Home Layout: One level                          Prior Functioning/Environment Level of Independence: Independent                 OT Problem List: Decreased strength;Decreased range of motion;Impaired UE functional use;Pain      OT Treatment/Interventions:      OT Goals(Current goals can be found in the care plan section) Acute Rehab OT Goals OT Goal Formulation: All assessment and education complete,  DC therapy  OT Frequency:     Barriers to D/C:            Co-evaluation              AM-PAC OT "6 Clicks" Daily Activity     Outcome Measure Help from another person eating meals?: A Little Help from another person taking care of personal grooming?: A Little Help from another person toileting, which includes using toliet, bedpan, or urinal?: A Lot Help from another person bathing (including washing, rinsing, drying)?: A Lot Help from another person to put on and taking off regular upper body clothing?: A Lot Help from another person to put on and taking off regular lower body clothing?: A Lot 6 Click Score: 14   End of Session Equipment Utilized During Treatment: Gait belt Nurse Communication:  (Ot education complete)  Activity Tolerance: Patient tolerated treatment well Patient left: in chair;with call bell/phone within reach;with family/visitor present  OT Visit Diagnosis: Pain;Muscle weakness (generalized) (M62.81)                Time: 4503-8882 OT Time Calculation (min): 28 min Charges:  OT General Charges $OT Visit: 1 Visit OT Treatments $Self Care/Home  Management : 8-22 mins  Waldron Session, OTR/L Acute Care Rehab Services  Office [phone] Pager: [phone]   Kelli Churn 10/13/2020, 2:42 PM

## 2020-10-13 NOTE — Op Note (Signed)
10/13/2020  11:43 AM  PATIENT:   Lindsay Peterson  75 y.o. female  PRE-OPERATIVE DIAGNOSIS:  Right shoulder rotator cuff tear arthropathy  POST-OPERATIVE DIAGNOSIS: Same  PROCEDURE: Right shoulder reverse arthroplasty utilizing a press-fit size 7 Arthrex stem with a neutral metaphysis, +6 polyethylene insert, 36/+4 glenosphere on a small/+2 baseplate  SURGEON:  Ambriana Selway, Vania Rea M.D.  ASSISTANTS: Ralene Bathe, PA-C  ANESTHESIA:   General endotracheal and interscalene block with Exparel  EBL: 150 cc  SPECIMEN: None  Drains: None   PATIENT DISPOSITION:  PACU - hemodynamically stable.    PLAN OF CARE: Discharge to home after PACU  Brief history:  Patient is a 75 year old female with remote history of open right shoulder rotator cuff repair, presents with chronic and progressive increasing right shoulder pain with restricted mobility.  Her examination shows painful and guarded motion with global weakness.  Plain radiographs confirm a retained metallic suture anchor within the humeral head.  Due to the patient's ongoing pain and functional rotations and failure to respond to prolonged attempts at conservative management, she is brought to the operating this time for planned right shoulder reverse arthroplasty  Preoperatively, I counseled the patient regarding treatment options and risks versus benefits thereof.  Possible surgical complications were all reviewed including potential for bleeding, infection, neurovascular injury, persistent pain, loss of motion, anesthetic complication, failure of the implant, and possible need for additional surgery. They understand and accept and agrees with our planned procedure.   Procedure in detail:  After undergoing routine preop evaluation the patient received prophylactic antibiotics and interscalene block with Exparel was established in the holding area by the anesthesia department.  Patient was subsequently placed supine on the operating  table and underwent the smooth induction of a general endotracheal anesthesia.  Placed into the beachchair position and appropriately padded and protected.  The right shoulder girdle region was sterilely prepped and draped in standard fashion.  Timeout was called.  A deltopectoral approach right shoulder was made through an approximately 8 cm incision.  Skin flaps were elevated dissection carried deeply and the deltopectoral interval was then developed from proximal to distal with the vein taken laterally.  Adhesions were divided beneath the deltoid.  The conjoined tendon was mobilized and retracted medially and the upper centimeter the pectoralis major was tenotomized for exposure.  The long head biceps tendon was then tenodesed at the upper border the pectoralis major and the proximal segment was then unroofed and excised.  The remnant of the rotator cuff superiorly was then divided from the apex of the bicipital groove to the base of the coracoid and the subscap was then separated from the lesser tuberosity and tagged with a pair of suture tape sutures.  Capsular attachments were then divided from the anterior and inferior margins of the humeral neck and the humeral head was then delivered through the wound.  An extra medullary guide was used to outline our proposed humeral head resection which was completed with an oscillating saw at approximate 20 degrees retroversion.  A metal cap was then placed over the cut proximal humeral surface and the glenoid was then exposed with appropriate retractors.  A circumferential labral resection was completed.  A guidepin was then directed into the center of the glenoid and the glenoid was and reamed with a central followed by peripheral reamer to a stable subchondral bony bed.  Preparation then completed with the central drill and tapped at 25 mm.  Our baseplate was then assembled.  Vancomycin powder placed  onto the lag screw threads and the baseplate was then inserted with  excellent purchase and fixation.  The peripheral locking screws were all then placed using standard technique with excellent fixation.  A 36/+4 glenosphere was then impacted onto the baseplate and the central locking screw was placed.  We then returned to attention back to the proximal humeral metaphysis where the canal was explored and the previously placed suture anchor and residual suture material were all carefully removed.  The canal was then opened and the humerus was broached up to a size 7 at approximate 20 degrees retroversion.  A neutral reaming guide was then placed in the metaphysis was then appropriately prepared.  A trial implant was placed and this showed good mobility good stability good soft tissue balance.  Trial was then removed and our final implant was assembled.  Vancomycin powder was then spread liberally into the humeral canal and the final implant was then seated with excellent purchase and fixation.  We then performed a series of trial reductions and ultimately felt that the +6 polyethylene insert gave [place] the best soft tissue balance with good motion good stability.  The trial polywas removed.  The implant was cleaned and dried and the final polyp was impacted.  Final reduction performed again showing good motion good stability good soft tissue balance.  At this point we confirmed that the subscapularis had good mobility and it was then repaired back to the eyelets on the collar of our implant.  The balance of the vancomycin powder was then spread liberally throughout the deep soft tissue layers after final irrigation and hemostasis had been obtained.  The deltopectoral interval was reapproximated with a series of figure-of-eight and 1 Vicryl sutures.  2-0 Monocryl used to close the subcu layer and intracuticular 3-0 Monocryl for the skin followed by Dermabond and Aquacel dressing.  The right arm was then placed in a sling.  Patient was awakened, extubated, and taken to the recovery room in  stable condition.  Ralene Bathe, PA-C was utilized as an Geophysicist/field seismologist throughout this case, essential for help with positioning the patient, positioning extremity, tissue manipulation, implantation of the prosthesis, suture management, wound closure, and intraoperative decision-making.  Senaida Lange MD   Contact # [phone]

## 2020-10-13 NOTE — Anesthesia Postprocedure Evaluation (Signed)
Anesthesia Post Note  Patient: Lindsay Peterson  Procedure(s) Performed: REVERSE SHOULDER ARTHROPLASTY (Right: Shoulder)     Patient location during evaluation: PACU Anesthesia Type: General Level of consciousness: awake and alert Pain management: pain level controlled Vital Signs Assessment: post-procedure vital signs reviewed and stable Respiratory status: spontaneous breathing, nonlabored ventilation, respiratory function stable and patient connected to nasal cannula oxygen Cardiovascular status: blood pressure returned to baseline and stable Postop Assessment: no apparent nausea or vomiting Anesthetic complications: no   No notable events documented.  Last Vitals:  Vitals:   10/13/20 1245 10/13/20 1305  BP: (!) 145/76 (!) 146/84  Pulse: 75 76  Resp: 16 16  Temp: (!) 36.4 C   SpO2: 92% 92%    Last Pain:  Vitals:   10/13/20 1305  TempSrc:   PainSc: 0-No pain                 Minda Faas S

## 2020-10-13 NOTE — Anesthesia Procedure Notes (Signed)
Procedure Name: Intubation Date/Time: 10/13/2020 10:16 AM Performed by: Mitzie Na, CRNA Pre-anesthesia Checklist: Patient identified, Emergency Drugs available, Suction available and Patient being monitored Patient Re-evaluated:Patient Re-evaluated prior to induction Oxygen Delivery Method: Circle system utilized Preoxygenation: Pre-oxygenation with 100% oxygen Induction Type: IV induction Ventilation: Mask ventilation without difficulty and Oral airway inserted - appropriate to patient size Laryngoscope Size: Mac and 3 Grade View: Grade I Tube type: Oral Tube size: 7.0 mm Number of attempts: 1 Airway Equipment and Method: Stylet and Oral airway Placement Confirmation: ETT inserted through vocal cords under direct vision, positive ETCO2 and breath sounds checked- equal and bilateral Secured at: 24 cm Tube secured with: Tape Dental Injury: Teeth and Oropharynx as per pre-operative assessment

## 2020-10-13 NOTE — Anesthesia Procedure Notes (Signed)
Anesthesia Procedure Image    

## 2020-10-13 NOTE — Progress Notes (Signed)
Assisted Dr. Rose with right, ultrasound guided, interscalene  block. Side rails up, monitors on throughout procedure. See vital signs in flow sheet. Tolerated Procedure well. 

## 2020-10-13 NOTE — Anesthesia Preprocedure Evaluation (Signed)
Anesthesia Evaluation  Patient identified by MRN, date of birth, ID band Patient awake    Reviewed: Allergy & Precautions, NPO status , Patient's Chart, lab work & pertinent test results  History of Anesthesia Complications (+) PONV  Airway Mallampati: III  TM Distance: <3 FB Neck ROM: Limited    Dental no notable dental hx.    Pulmonary neg pulmonary ROS,    Pulmonary exam normal breath sounds clear to auscultation       Cardiovascular hypertension, Normal cardiovascular exam Rhythm:Regular Rate:Normal     Neuro/Psych negative neurological ROS  negative psych ROS   GI/Hepatic Neg liver ROS, GERD  ,  Endo/Other  diabetes  Renal/GU negative Renal ROS  negative genitourinary   Musculoskeletal negative musculoskeletal ROS (+)   Abdominal   Peds negative pediatric ROS (+)  Hematology negative hematology ROS (+)   Anesthesia Other Findings   Reproductive/Obstetrics negative OB ROS                             Anesthesia Physical Anesthesia Plan  ASA: 2  Anesthesia Plan: General   Post-op Pain Management:  Regional for Post-op pain   Induction: Intravenous  PONV Risk Score and Plan: 4 or greater and Ondansetron, Dexamethasone, Treatment may vary due to age or medical condition and Droperidol  Airway Management Planned: Oral ETT  Additional Equipment:   Intra-op Plan:   Post-operative Plan: Extubation in OR  Informed Consent: I have reviewed the patients History and Physical, chart, labs and discussed the procedure including the risks, benefits and alternatives for the proposed anesthesia with the patient or authorized representative who has indicated his/her understanding and acceptance.     Dental advisory given  Plan Discussed with: CRNA and Surgeon  Anesthesia Plan Comments:         Anesthesia Quick Evaluation

## 2020-10-13 NOTE — Transfer of Care (Signed)
Immediate Anesthesia Transfer of Care Note  Patient: Lindsay Peterson  Procedure(s) Performed: REVERSE SHOULDER ARTHROPLASTY (Right: Shoulder)  Patient Location: PACU  Anesthesia Type:General  Level of Consciousness: awake, alert , oriented and patient cooperative  Airway & Oxygen Therapy: Patient Spontanous Breathing and Patient connected to face mask oxygen  Post-op Assessment: Report given to RN and Post -op Vital signs reviewed and stable  Post vital signs: Reviewed and stable  Last Vitals:  Vitals Value Taken Time  BP 152/73 10/13/20 1215  Temp 36.6 C 10/13/20 1152  Pulse 87 10/13/20 1222  Resp 17 10/13/20 1222  SpO2 99 % 10/13/20 1222  Vitals shown include unvalidated device data.  Last Pain:  Vitals:   10/13/20 1215  TempSrc:   PainSc: Asleep      Patients Stated Pain Goal: 3 (10/13/20 0916)  Complications: No notable events documented.

## 2020-10-13 NOTE — Anesthesia Procedure Notes (Signed)
Anesthesia Regional Block: Interscalene brachial plexus block   Pre-Anesthetic Checklist: , timeout performed,  Correct Patient, Correct Site, Correct Laterality,  Correct Procedure, Correct Position, site marked,  Risks and benefits discussed,  Surgical consent,  Pre-op evaluation,  At surgeon's request and post-op pain management  Laterality: Right  Prep: chloraprep       Needles:  Injection technique: Single-shot  Needle Type: Echogenic Needle     Needle Length: 9cm      Additional Needles:   Procedures:, nerve stimulator,,, ultrasound used (permanent image in chart),,     Nerve Stimulator or Paresthesia:  Response: 0.5 mA  Additional Responses:   Narrative:  Start time: 10/13/2020 9:20 AM End time: 10/13/2020 9:28 AM Injection made incrementally with aspirations every 5 mL.  Performed by: Personally  Anesthesiologist: Eilene Ghazi, MD  Additional Notes: Patient tolerated the procedure well without complications

## 2020-10-13 NOTE — Discharge Instructions (Signed)
 Kevin M. Supple, M.D., F.A.A.O.S. [facility] Specializing in Arthroscopic and Reconstructive Surgery of the Shoulder [phone] [address] - [place], [place] [address] - Fax [phone]   POST-OP TOTAL SHOULDER REPLACEMENT INSTRUCTIONS  1. Follow up in the office for your first post-op appointment 10-14 days from the date of your surgery. If you do not already have a scheduled appointment, our office will contact you to schedule.  2. The bandage over your incision is waterproof. You may begin showering with this dressing on. You may leave this dressing on until first follow up appointment within 2 weeks. We prefer you leave this dressing in place until follow up however after 5-7 days if you are having itching or skin irritation and would like to remove it you may do so. Go slow and tug at the borders gently to break the bond the dressing has with the skin. At this point if there is no drainage it is okay to go without a bandage or you may cover it with a light guaze and tape. You can also expect significant bruising around your shoulder that will drift down your arm and into your chest wall. This is very normal and should resolve over several days.   3. Wear your sling/immobilizer at all times except to perform the exercises below or to occasionally let your arm dangle by your side to stretch your elbow. You also need to sleep in your sling immobilizer until instructed otherwise. It is ok to remove your sling if you are sitting in a controlled environment and allow your arm to rest in a position of comfort by your side or on your lap with pillows to give your neck and skin a break from the sling. You may remove it to allow arm to dangle by side to shower. If you are up walking around and when you go to sleep at night you need to wear it.  4. Range of motion to your elbow, wrist, and hand are encouraged 3-5 times daily. Exercise to your hand and fingers helps to reduce  swelling you may experience.   5. Prescriptions for a pain medication and a muscle relaxant are provided for you. It is recommended that if you are experiencing pain that you pain medication alone is not controlling, add the muscle relaxant along with the pain medication which can give additional pain relief. The first 1-2 days is generally the most severe of your pain and then should gradually decrease. As your pain lessens it is recommended that you decrease your use of the pain medications to an "as needed basis'" only and to always comply with the recommended dosages of the pain medications.  6. Pain medications can produce constipation along with their use. If you experience this, the use of an over the counter stool softener or laxative daily is recommended.   7. For additional questions or concerns, please do not hesitate to call the office. If after hours there is an answering service to forward your concerns to the physician on call.  8.Pain control following an exparel block  To help control your post-operative pain you received a nerve block  performed with Exparel which is a long acting anesthetic (numbing agent) which can provide pain relief and sensations of numbness (and relief of pain) in the operative shoulder and arm for up to 3 days. Sometimes it provides mixed relief, meaning you may still have numbness in certain areas of the arm but can still be able to   move  parts of that arm, hand, and fingers. We recommend that your prescribed pain medications  be used as needed. We do not feel it is necessary to "pre medicate" and "stay ahead" of pain.  Taking narcotic pain medications when you are not having any pain can lead to unnecessary and potentially dangerous side effects.    9. Use the ice machine as much as possible in the first 5-7 days from surgery, then you can wean its use to as needed. The ice typically needs to be replaced every 6 hours, instead of ice you can actually freeze  water bottles to put in the cooler and then fill water around them to avoid having to purchase ice. You can have spare water bottles freezing to allow you to rotate them once they have melted. Try to have a thin shirt or light cloth or towel under the ice wrap to protect your skin.   FOR ADDITIONAL INFO ON ICE MACHINE AND INSTRUCTIONS GO TO THE WEBSITE AT  https://www.[employer].com/products/donjoy/[email or web address]-[email or web address]  10.  We recommend that you avoid any dental work or cleaning in the first 3 months following your joint replacement. This is to help minimize the possibility of infection from the bacteria in your mouth that enters your bloodstream during dental work. We also recommend that you take an antibiotic prior to your dental work for the first year after your shoulder replacement to further help reduce that risk. Please simply contact our office for antibiotics to be sent to your pharmacy prior to dental work.  11. Dental Antibiotics:  In most cases prophylactic antibiotics for Dental procdeures after total joint surgery are not necessary.  Exceptions are as follows:  1. History of prior total joint infection  2. Severely immunocompromised (Organ Transplant, cancer chemotherapy, Rheumatoid biologic meds such as Humera)  3. Poorly controlled diabetes (A1C &gt; 8.0, blood glucose over 200)  If you have one of these conditions, contact your surgeon for an antibiotic prescription, prior to your dental procedure.   POST-OP EXERCISES  Pendulum Exercises  Perform pendulum exercises while standing and bending at the waist. Support your uninvolved arm on a table or chair and allow your operated arm to hang freely. Make sure to do these exercises passively - not using you shoulder muscles. These exercises can be performed once your nerve block effects have worn off.  Repeat 20 times. Do 3 sessions per day.     

## 2020-10-13 NOTE — H&P (Signed)
Lindsay Peterson    Chief Complaint: Right shoulder rotator cuff tear arthropathy HPI: The patient is a 75 y.o. female with past surgical history significant for right shoulder open rotator cuff repair who presents now with chronic and progressively increasing right shoulder pain with profound restrictions in mobility.  Due to her increasing functional imitations and failure to respond to  Past Medical History:  Diagnosis Date   Colitis    Complication of anesthesia    Diabetes mellitus without complication (HCC)    Diverticulitis    Family history of adverse reaction to anesthesia    PONV   GERD (gastroesophageal reflux disease)    Hypercholesterolemia    Hypertension    PONV (postoperative nausea and vomiting)     Past Surgical History:  Procedure Laterality Date   ABDOMINAL HYSTERECTOMY  1974   CHOLECYSTECTOMY  2018   SHOULDER ARTHROSCOPY Right 2019   TOTAL KNEE ARTHROPLASTY  2006   right    No family history on file.  Social History:  reports that she has never smoked. She has never used smokeless tobacco. She reports previous alcohol use. She reports that she does not use drugs.   Medications Prior to Admission  Medication Sig Dispense Refill   ALPRAZolam (XANAX) 0.5 MG tablet Take 0.5 mg by mouth at bedtime.     amLODipine (NORVASC) 2.5 MG tablet Take 2.5 mg by mouth daily.     aspirin EC 81 MG tablet Take 81 mg by mouth in the morning. Swallow whole.     dicyclomine (BENTYL) 20 MG tablet Take 20 mg by mouth 2 (two) times daily as needed for spasms.     hydrochlorothiazide (MICROZIDE) 12.5 MG capsule Take 12.5 mg by mouth daily.     hydroxypropyl methylcellulose / hypromellose (ISOPTO TEARS / GONIOVISC) 2.5 % ophthalmic solution Place 1 drop into both eyes 3 (three) times daily as needed for dry eyes.     hyoscyamine (ANASPAZ) 0.125 MG TBDP disintergrating tablet Place 0.125 mg under the tongue every 6 (six) hours as needed for cramping.     meloxicam (MOBIC) 15 MG  tablet Take 15 mg by mouth at bedtime.     montelukast (SINGULAIR) 10 MG tablet Take 10 mg by mouth at bedtime.     omeprazole (PRILOSEC) 20 MG capsule Take 20 mg by mouth 2 (two) times daily before a meal.     pravastatin (PRAVACHOL) 40 MG tablet Take 40 mg by mouth daily.     SitaGLIPtin-MetFORMIN HCl [phone] MG TB24 Take 1 tablet by mouth in the morning and at bedtime.     EPINEPHrine 0.3 mg/0.3 mL IJ SOAJ injection Inject 0.3 mg into the muscle as needed for anaphylaxis.       Physical Exam: Right shoulder demonstrates painful and globally restricted motion as noted at her recent office visit.  She has global weakness to manual muscle testing.  Well-healed anterolateral shoulder incision from prior open rotator cuff repair.  Radiographs  Recent plain film x-rays of the right shoulder demonstrate a retained suture anchor within the humeral head.  Humeral head does not appear to be grossly high riding.  Vitals  Temp:  [97.9 F (36.6 C)] 97.9 F (36.6 C) (06/09 0801) Pulse Rate:  [77] 77 (06/09 0801) Resp:  [17] 17 (06/09 0801) BP: (156)/(75) 156/75 (06/09 0801) SpO2:  [97 %] 97 % (06/09 0801) Weight:  [60 kg] 60 kg (06/09 0759)  Assessment/Plan  Impression: Right shoulder rotator cuff tear arthropathy  Plan of Action: Procedure(s):  REVERSE SHOULDER ARTHROPLASTY  Nazar Kuan M Ashyla Luth 10/13/2020, 9:25 AM Contact # [phone]

## 2020-10-14 ENCOUNTER — Encounter (HOSPITAL_COMMUNITY): Payer: Self-pay | Admitting: Orthopedic Surgery

## 2022-09-01 IMAGING — CT CT HEAD W/O CM
3 series · 15 of 47 positions shown, 18 images · non-contrast
Comparison: Report of prior head CT examination July 02, 2008
available; images from that study not available.

CLINICAL DATA: Headache with blurred vision

EXAM:
CT HEAD WITHOUT CONTRAST
TECHNIQUE: Contiguous axial images were obtained from the base of the skull
through the vertex without intravenous contrast.

[Series 3: head 5.0 h30s · axial · 0.42mm/px · z∈[-142,-17]mm · 9 of 31 slices shown, 12 images]
[im 3/31  brain]
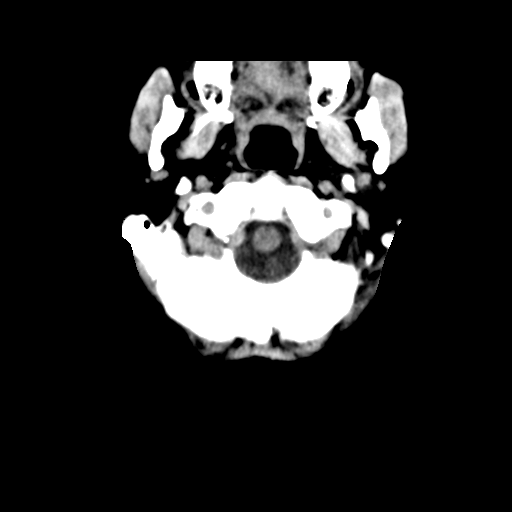
[im 3/31  bone]
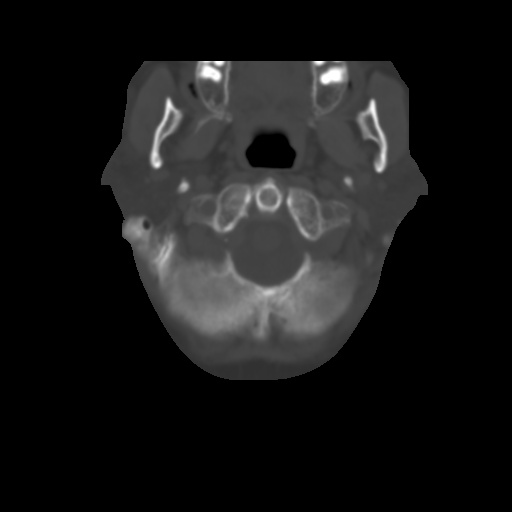
[im 6/31  brain]
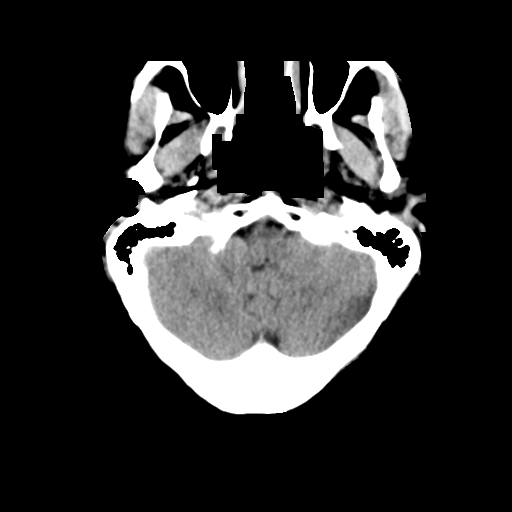
[im 9/31  brain]
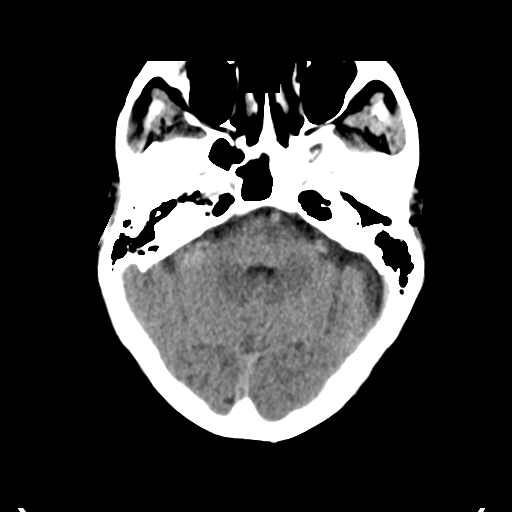
[im 12/31  brain]
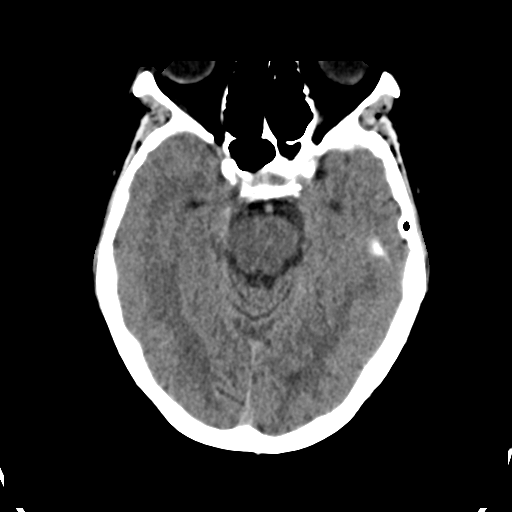
[im 16/31  brain]
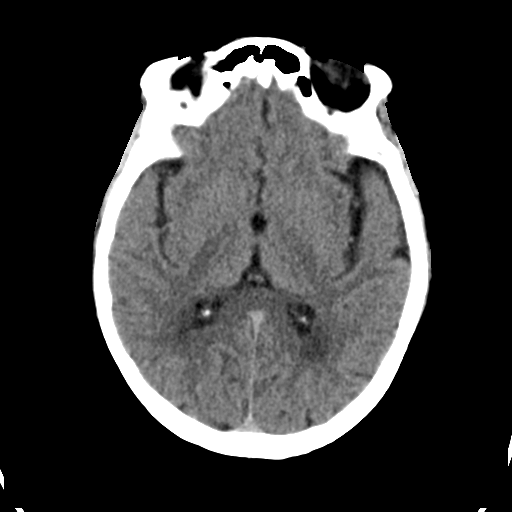
[im 16/31  bone]
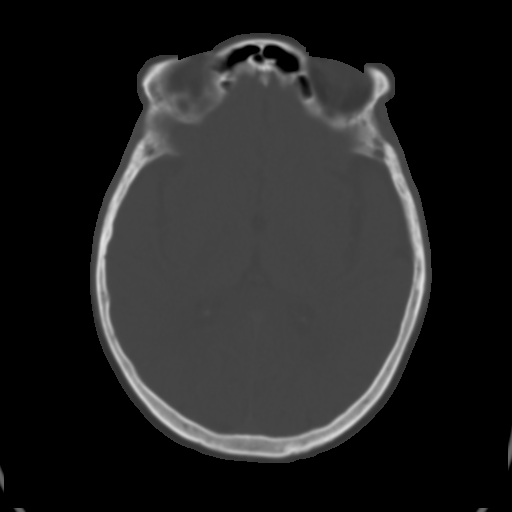
[im 19/31  brain]
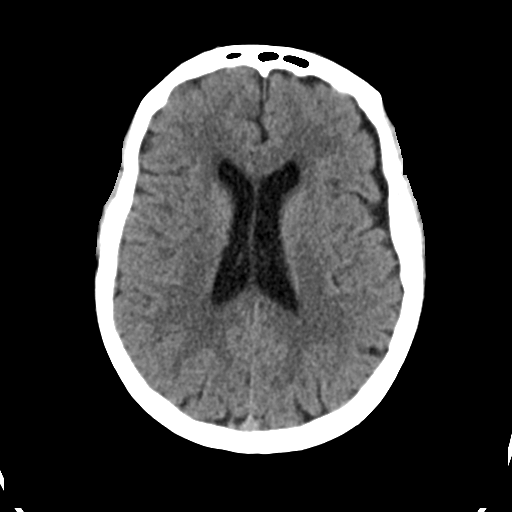
[im 22/31  brain]
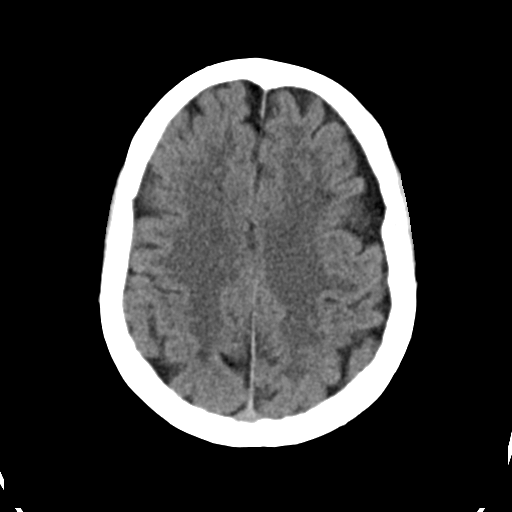
[im 25/31  brain]
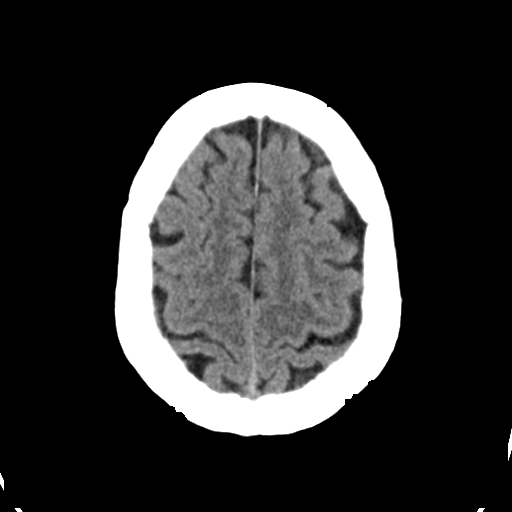
[im 28/31  brain]
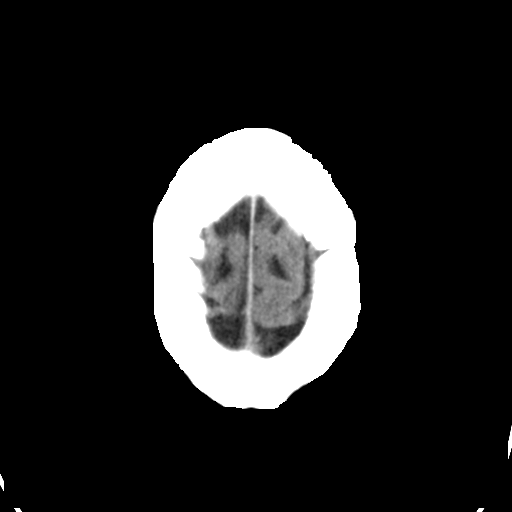
[im 28/31  bone]
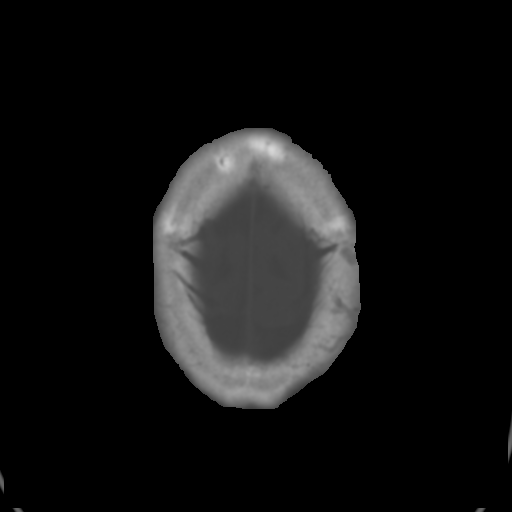

[Series 5: head 3.0 mpr cor · coronal · 0.32mm/px · 3 of 67 slices shown]
[im 23/67  brain]
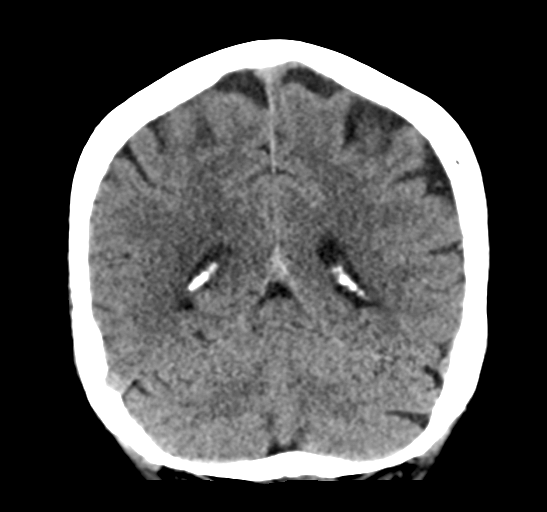
[im 30/67  brain]
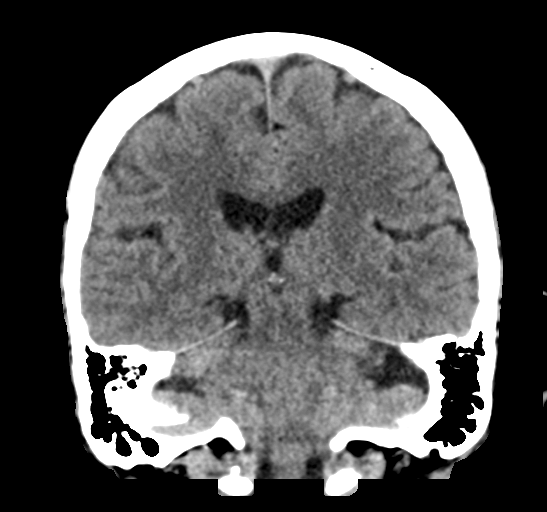
[im 37/67  brain]
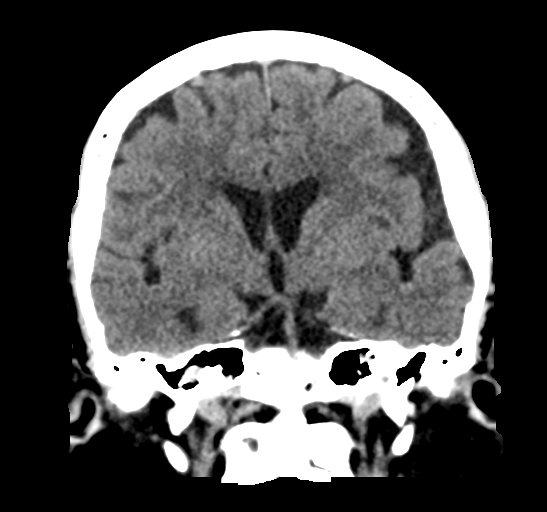

[Series 6: head 3.0 mpr sag · sagittal · 0.30mm/px · 3 of 67 slices shown]
[im 23/67  brain]
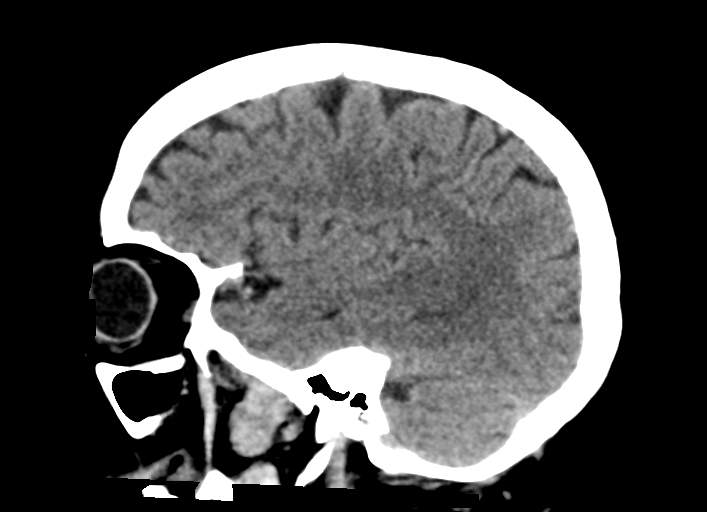
[im 34/67  brain]
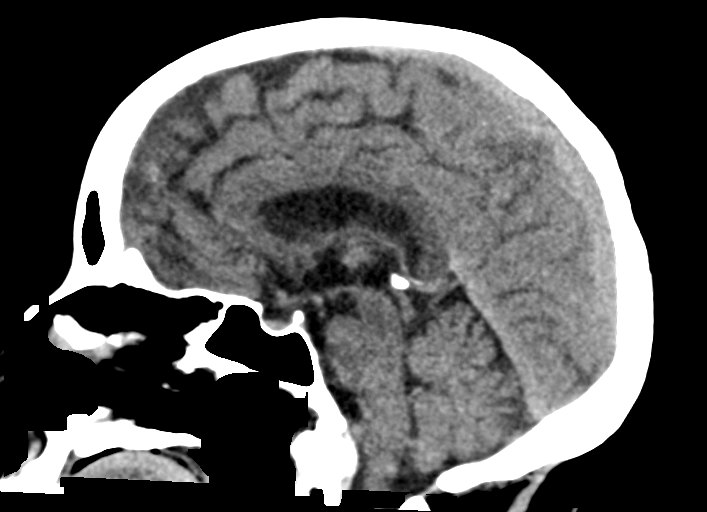
[im 45/67  brain]
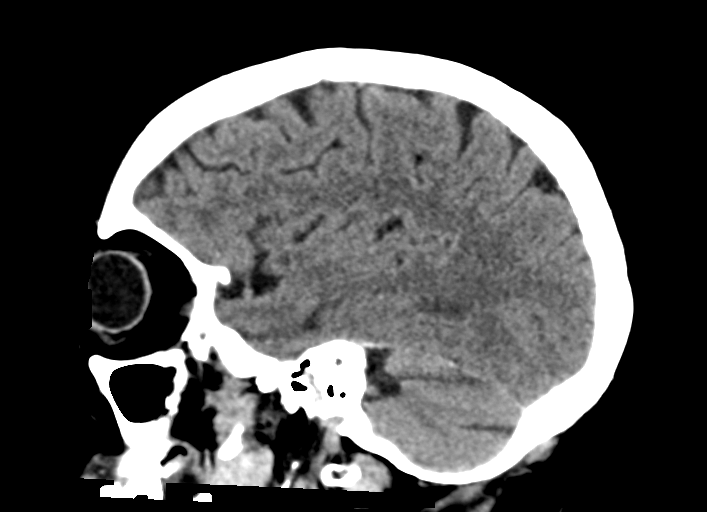

[15 of 47 positions shown; findings below may reference images not displayed]

FINDINGS: Brain: The ventricles are normal in size and configuration. There is
asymmetric mild left frontal and parietal lobe atrophy. There is no
intracranial mass, hemorrhage, extra-axial fluid collection, or
midline shift. There is slight decreased attenuation in portions of
the centra semiovale bilaterally. No acute infarct is demonstrable.

Vascular: No hyperdense vessel. There are foci of calcification in
each carotid siphon region.

Skull: Bony calvarium appears intact.

Sinuses/Orbits: There is mucosal thickening in several ethmoid air
cells. Orbits appear symmetric bilaterally.

Other: Mastoid air cells are clear.
IMPRESSION: Mild left frontal and parietal lobe atrophy. Ventricles normal in
size and configuration. There is slight periventricular small vessel
disease. No acute infarct. No mass or hemorrhage.

There are foci of arterial vascular calcification. There is mucosal
thickening in several ethmoid air cells.
